# Patient Record
Sex: Female | Born: 1950 | Race: Black or African American | Hispanic: No | Marital: Single
Health system: Southern US, Community
[De-identification: ages and names within clinical notes are randomized; demographics above are authoritative.]

## PROBLEM LIST (undated history)

## (undated) DIAGNOSIS — I10 Essential (primary) hypertension: Secondary | ICD-10-CM

## (undated) DIAGNOSIS — N186 End stage renal disease: Secondary | ICD-10-CM

## (undated) DIAGNOSIS — E785 Hyperlipidemia, unspecified: Secondary | ICD-10-CM

## (undated) HISTORY — PX: CORONARY ANGIOPLASTY WITH STENT PLACEMENT: SHX49

---

## 2016-01-12 ENCOUNTER — Emergency Department (HOSPITAL_COMMUNITY)
Admission: EM | Admit: 2016-01-12 | Discharge: 2016-01-12 | Disposition: A | Discharge status: Home / Self Care | Payer: 59 | Attending: Emergency Medicine | Admitting: Emergency Medicine

## 2016-01-12 ENCOUNTER — Emergency Department (HOSPITAL_COMMUNITY): Payer: 59

## 2016-01-12 ENCOUNTER — Encounter (HOSPITAL_COMMUNITY): Payer: Self-pay | Admitting: *Deleted

## 2016-01-12 DIAGNOSIS — Y929 Unspecified place or not applicable: Secondary | ICD-10-CM | POA: Insufficient documentation

## 2016-01-12 DIAGNOSIS — I120 Hypertensive chronic kidney disease with stage 5 chronic kidney disease or end stage renal disease: Secondary | ICD-10-CM | POA: Insufficient documentation

## 2016-01-12 DIAGNOSIS — Z79899 Other long term (current) drug therapy: Secondary | ICD-10-CM | POA: Insufficient documentation

## 2016-01-12 DIAGNOSIS — W0110XA Fall on same level from slipping, tripping and stumbling with subsequent striking against unspecified object, initial encounter: Secondary | ICD-10-CM | POA: Insufficient documentation

## 2016-01-12 DIAGNOSIS — Y939 Activity, unspecified: Secondary | ICD-10-CM | POA: Diagnosis not present

## 2016-01-12 DIAGNOSIS — N186 End stage renal disease: Secondary | ICD-10-CM | POA: Diagnosis not present

## 2016-01-12 DIAGNOSIS — Z791 Long term (current) use of non-steroidal anti-inflammatories (NSAID): Secondary | ICD-10-CM | POA: Diagnosis not present

## 2016-01-12 DIAGNOSIS — Z7982 Long term (current) use of aspirin: Secondary | ICD-10-CM | POA: Diagnosis not present

## 2016-01-12 DIAGNOSIS — Z23 Encounter for immunization: Secondary | ICD-10-CM | POA: Diagnosis not present

## 2016-01-12 DIAGNOSIS — S8252XA Displaced fracture of medial malleolus of left tibia, initial encounter for closed fracture: Secondary | ICD-10-CM

## 2016-01-12 DIAGNOSIS — Z955 Presence of coronary angioplasty implant and graft: Secondary | ICD-10-CM | POA: Diagnosis not present

## 2016-01-12 DIAGNOSIS — Y999 Unspecified external cause status: Secondary | ICD-10-CM | POA: Diagnosis not present

## 2016-01-12 DIAGNOSIS — S90512A Abrasion, left ankle, initial encounter: Secondary | ICD-10-CM | POA: Diagnosis present

## 2016-01-12 HISTORY — DX: Hyperlipidemia, unspecified: E78.5

## 2016-01-12 HISTORY — DX: End stage renal disease: N18.6

## 2016-01-12 HISTORY — DX: Essential (primary) hypertension: I10

## 2016-01-12 MED ORDER — OXYCODONE-ACETAMINOPHEN 5-325 MG PO TABS: 1.0000 | ORAL_TABLET | Freq: Four times a day (QID) | ORAL | Status: AC | PRN

## 2016-01-12 MED ORDER — NAPROXEN 500 MG PO TABS: 500.0000 mg | ORAL_TABLET | Freq: Two times a day (BID) | ORAL | Status: AC

## 2016-01-12 MED ORDER — TETANUS-DIPHTH-ACELL PERTUSSIS 5-2.5-18.5 LF-MCG/0.5 IM SUSP
0.5000 mL | Freq: Once | INTRAMUSCULAR | Status: AC
  Administered 2016-01-12: 0.5 mL via INTRAMUSCULAR
  Filled 2016-01-12: qty 0.5

## 2016-01-12 MED ORDER — OXYCODONE-ACETAMINOPHEN 5-325 MG PO TABS
2.0000 | ORAL_TABLET | Freq: Once | ORAL | Status: AC
  Administered 2016-01-12: 2 via ORAL
  Filled 2016-01-12: qty 2

## 2016-01-12 NOTE — ED Notes (Signed)
Pt come by EMS with left ankle injury pt tried to get into the jeep but she fell back, no LOC, abrasion to foot/ankle area.  Pt has DM and CBG: 106,

## 2016-01-12 NOTE — Discharge Instructions (Signed)
Wear your splint at all times. Use crutches to prevent from putting weight on your left foot. Take naproxen for pain control. Ice your ankle 3-4 times per day for 15-20 minutes each time. Try and elevate your left leg as much as possible. Follow-up with an orthopedist to ensure proper healing. You may take Percocet as needed for severe pain. Do not drive or drink alcohol after taking this medication.  Ankle Fracture A fracture is a break in a bone. The ankle joint is made up of three bones. These include the lower (distal)sections of your lower leg bones, called the tibia and fibula, along with a bone in your foot, called the talus. Depending on how bad the break is and if more than one ankle joint bone is broken, a cast or splint is used to protect and keep your injured bone from moving while it heals. Sometimes, surgery is required to help the fracture heal properly.  There are two general types of fractures:  Stable fracture. This includes a single fracture line through one bone, with no injury to ankle ligaments. A fracture of the talus that does not have any displacement (movement of the bone on either side of the fracture line) is also stable.  Unstable fracture. This includes more than one fracture line through one or more bones in the ankle joint. It also includes fractures that have displacement of the bone on either side of the fracture line. CAUSES  A direct blow to the ankle.   Quickly and severely twisting your ankle.  Trauma, such as a car accident or falling from a significant height. RISK FACTORS You may be at a higher risk of ankle fracture if:  You have certain medical conditions.  You are involved in high-impact sports.  You are involved in a high-impact car accident. SIGNS AND SYMPTOMS   Tender and swollen ankle.  Bruising around the injured ankle.  Pain on movement of the ankle.  Difficulty walking or putting weight on the ankle.  A cold foot below the site of  the ankle injury. This can occur if the blood vessels passing through your injured ankle were also damaged.  Numbness in the foot below the site of the ankle injury. DIAGNOSIS  An ankle fracture is usually diagnosed with a physical exam and X-rays. A CT scan may also be required for complex fractures. TREATMENT  Stable fractures are treated with a cast or splint and using crutches to avoid putting weight on your injured ankle. This is followed by an ankle strengthening program. Some patients require a special type of cast, depending on other medical problems they may have. Unstable fractures require surgery to ensure the bones heal properly. Your health care provider will tell you what type of fracture you have and the best treatment for your condition. HOME CARE INSTRUCTIONS   Review correct crutch use with your health care provider and use your crutches as directed. Safe use of crutches is extremely important. Misuse of crutches can cause you to fall or cause injury to nerves in your hands or armpits.  Do not put weight or pressure on the injured ankle until directed by your health care provider.  To lessen the swelling, keep the injured leg elevated while sitting or lying down.  Apply ice to the injured area:  Put ice in a plastic bag.  Place a towel between your cast and the bag.  Leave the ice on for 20 minutes, 2-3 times a day.  If you have  a plaster or fiberglass cast:  Do not try to scratch the skin under the cast with any objects. This can increase your risk of skin infection.  Check the skin around the cast every day. You may put lotion on any red or sore areas.  Keep your cast dry and clean.  If you have a plaster splint:  Wear the splint as directed.  You may loosen the elastic around the splint if your toes become numb, tingle, or turn cold or blue.  Do not put pressure on any part of your cast or splint; it may break. Rest your cast only on a pillow the first 24  hours until it is fully hardened.  Your cast or splint can be protected during bathing with a plastic bag sealed to your skin with medical tape. Do not lower the cast or splint into water.  Take medicines as directed by your health care provider. Only take over-the-counter or prescription medicines for pain, discomfort, or fever as directed by your health care provider.  Do not drive a vehicle until your health care provider specifically tells you it is safe to do so.  If your health care provider has given you a follow-up appointment, it is very important to keep that appointment. Not keeping the appointment could result in a chronic or permanent injury, pain, and disability. If you have any problem keeping the appointment, call the facility for assistance. SEEK MEDICAL CARE IF: You develop increased swelling or discomfort. SEEK IMMEDIATE MEDICAL CARE IF:   Your cast gets damaged or breaks.  You have continued severe pain.  You develop new pain or swelling after the cast was put on.  Your skin or toenails below the injury turn blue or gray.  Your skin or toenails below the injury feel cold, numb, or have loss of sensitivity to touch.  There is a bad smell or pus draining from under the cast. MAKE SURE YOU:   Understand these instructions.  Will watch your condition.  Will get help right away if you are not doing well or get worse.   This information is not intended to replace advice given to you by your health care provider. Make sure you discuss any questions you have with your health care provider.   Document Released: 07/26/2000 Document Revised: 08/03/2013 Document Reviewed: 02/25/2013 Elsevier Interactive Patient Education [employer]2016 [employer]

## 2016-01-12 NOTE — ED Notes (Signed)
[facility] Expected date:  Expected time:  Means of arrival:  Comments: EMS 65yo F ankle pain

## 2016-01-12 NOTE — ED Provider Notes (Signed)
CSN: [id number]     Arrival date & time 01/12/16  2042 History   First MD Initiated Contact with Patient 01/12/16 2054     Chief Complaint  Patient presents with  . Ankle Injury    Left     (Consider location/radiation/quality/duration/timing/severity/associated sxs/prior Treatment) HPI Comments: 65 year old female presents to the emergency department for evaluation of left ankle pain. Patient states that she was trying to get into a jeep when she fell backwards causing her left ankle to roll. She has an abrasion to the medial aspect of her ankle which she attributes to that side hitting the ground. She cannot recall the date of her last tetanus shot. Patient reports that pain is worse with ambulation. No medications taken prior to arrival. Patient transported to the ED by EMS. She does have a history of diabetes mellitus. She denies head trauma or loss of consciousness from her fall. No history of prior left ankle injury.  Patient is a 65 y.o. female presenting with lower extremity injury. The history is provided by the patient. No language interpreter was used.  Ankle Injury Associated symptoms include arthralgias and joint swelling. Pertinent negatives include no numbness or weakness.    Past Medical History  Diagnosis Date  . ESRD (end stage renal disease) (HCC)   . Hypertension   . Hyperlipidemia    Past Surgical History  Procedure Laterality Date  . Coronary angioplasty with stent placement     No family history on file. Social History  Substance Use Topics  . Smoking status: Not on file  . Smokeless tobacco: Not on file  . Alcohol Use: Not on file   OB History    No data available      Review of Systems  Musculoskeletal: Positive for joint swelling and arthralgias.  Skin: Positive for wound.  Neurological: Negative for weakness and numbness.  Ten systems reviewed and are negative for acute change, except as noted in the HPI.    Allergies  Review of patient's  allergies indicates no known allergies.  Home Medications   Prior to Admission medications   Medication Sig Start Date End Date Taking? Authorizing Provider  amLODipine (NORVASC) 5 MG tablet Take 5 mg by mouth daily. 12/17/15  Yes Historical Provider, MD  aspirin EC 81 MG tablet Take 81 mg by mouth daily.   Yes Historical Provider, MD  metoprolol (LOPRESSOR) 50 MG tablet Take 50 mg by mouth 2 (two) times daily. 12/24/15  Yes Historical Provider, MD  naproxen (NAPROSYN) 500 MG tablet Take 1 tablet (500 mg total) by mouth 2 (two) times daily. 01/12/16   Antony MaduraKelly Mylee Falin, PA-C  oxyCODONE-acetaminophen (PERCOCET/ROXICET) 5-325 MG tablet Take 1-2 tablets by mouth every 6 (six) hours as needed for severe pain. 01/12/16   Antony MaduraKelly Nalia Honeycutt, PA-C   BP 194/110 mmHg  Pulse 95  Temp(Src) 98.6 F (37 C) (Oral)  Resp 20  SpO2 96%   Physical Exam  Constitutional: [name], place, and time. She appears well-developed and well-nourished. No distress.  Nontoxic appearing and in no distress.  HENT:  Head: Normocephalic and atraumatic.  Eyes: Conjunctivae and EOM are normal. No scleral icterus.  Neck: Normal range of motion.  Cardiovascular: Normal rate, regular rhythm and intact distal pulses.   DP and PT pulses 2+ in the LLE Capillary refill brisk in all digits of L foot.  Pulmonary/Chest: Effort normal. No respiratory distress.  Respirations even and unlabored  Musculoskeletal: Normal range of motion.  Neurological: She is  alert and oriented to person, place, and time. She exhibits normal muscle tone. Coordination normal.  Sensation to light touch intact in the LLE. Patient able to wiggle all toes.  Skin: Skin is warm and dry. No rash noted. She is not diaphoretic. No erythema. No pallor.  Psychiatric: She has a normal mood and affect. Her behavior is normal.  Nursing note and vitals reviewed.   ED Course  Procedures (including critical care time) Labs Review Labs Reviewed - No data to  display  Imaging Review Dg Ankle Complete Left  01/12/2016  CLINICAL DATA:  Fall, left ankle injury. EXAM: LEFT ANKLE COMPLETE - 3+ VIEW COMPARISON:  None. FINDINGS: There is a transverse fracture through the medial malleolus. No fibular abnormality. Ankle mortise is intact. Degenerative changes in the left ankle. Diffuse soft tissue swelling. IMPRESSION: Transverse fracture through the medial malleolus. Electronically Signed   By: Charlett Nose M.D.   On: 01/12/2016 21:42     I have personally reviewed and evaluated these images and lab results as part of my medical decision-making.   EKG Interpretation None      MDM   Final diagnoses:  Medial malleolar fracture, left, closed, initial encounter    65 year old female presents to the emergency department for evaluation of left ankle pain. She is neurovascularly intact. X-ray shows a transverse fracture of the medial malleolus. Stirrup splint initially ordered, the patient is a poor candidate for this given her body habitus. Will place in [facility] and provided crutches. Patient referred to orthopedics for follow-up to ensure proper healing. Return precautions discussed and provided. Patient discharged in satisfactory condition with no unaddressed concerns.    Antony Madura, PA-C 01/13/16 0011  Doug Sou, MD 01/13/16 [phone]

## 2017-02-01 IMAGING — CR DG ANKLE COMPLETE 3+V*L*
3 series · 3 of 3 positions shown · non-contrast
Comparison: None.

CLINICAL DATA: Fall, left ankle injury.

EXAM:
LEFT ANKLE COMPLETE - 3+ VIEW

[x ankle lat left]
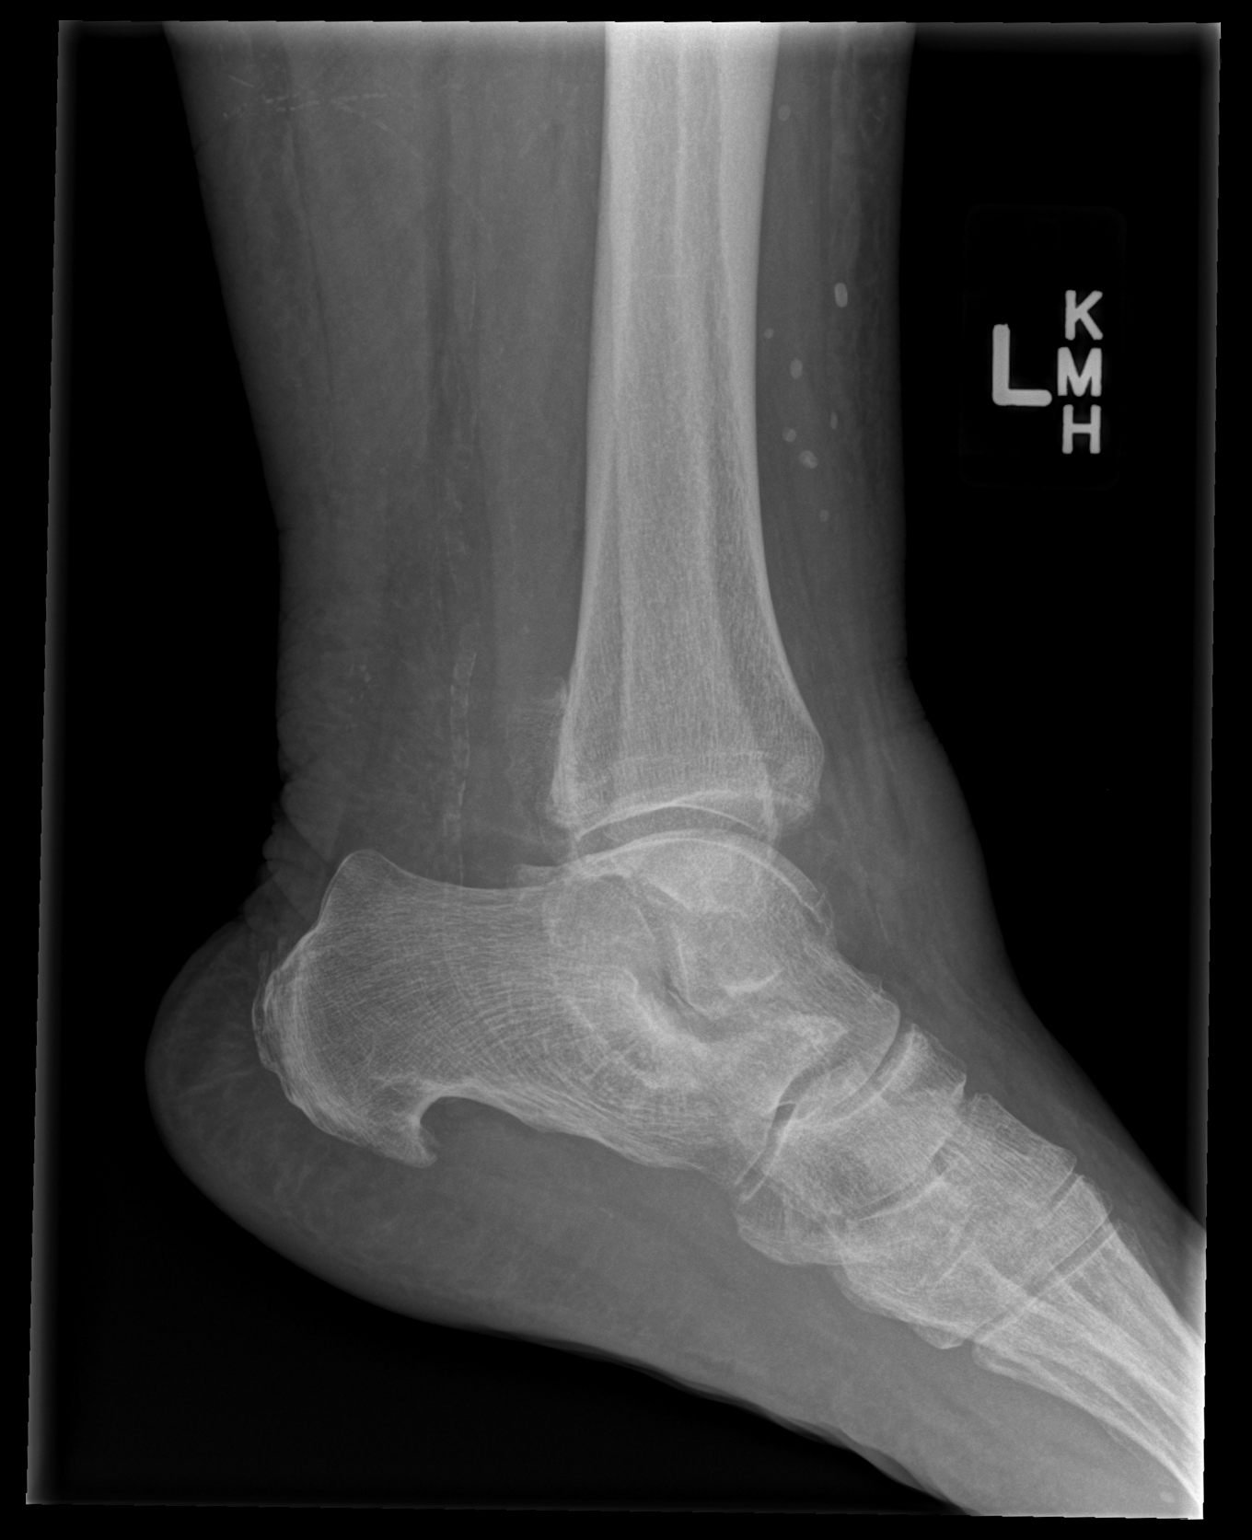

[x ankle ap left]
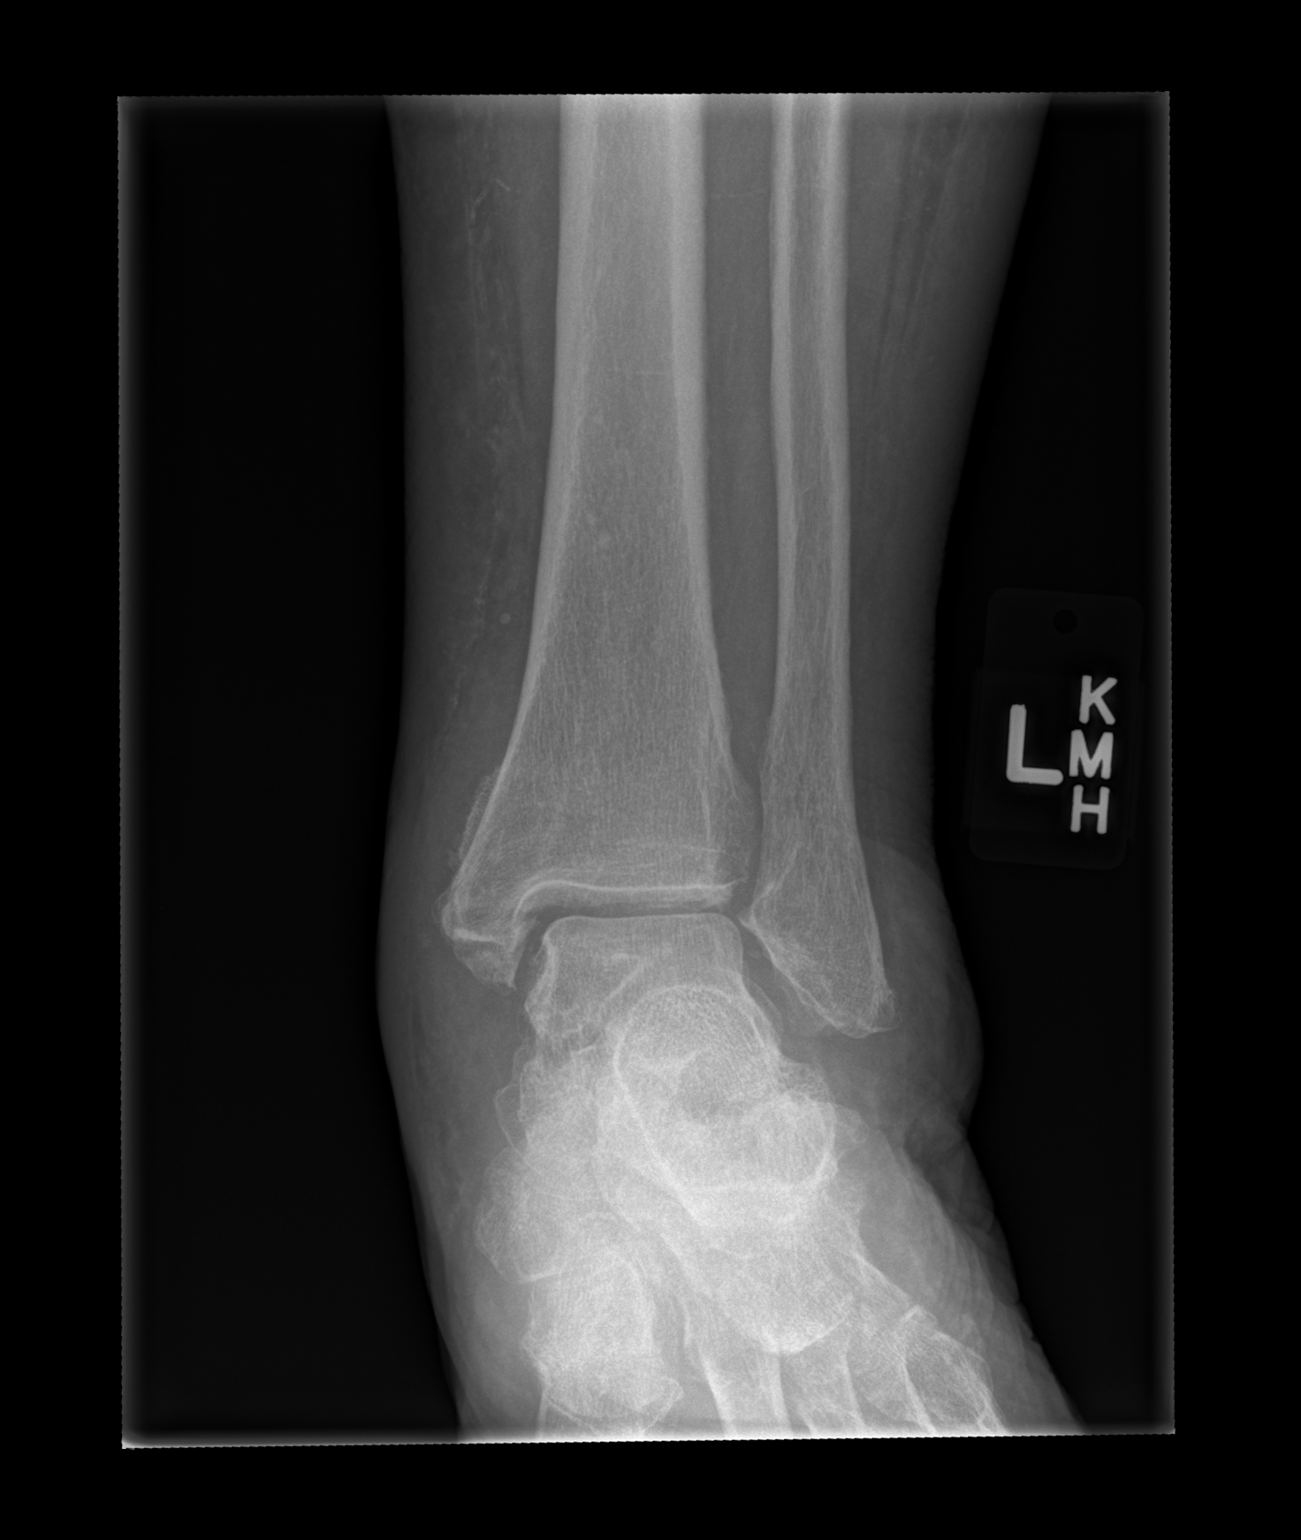

[x ankle obl left]
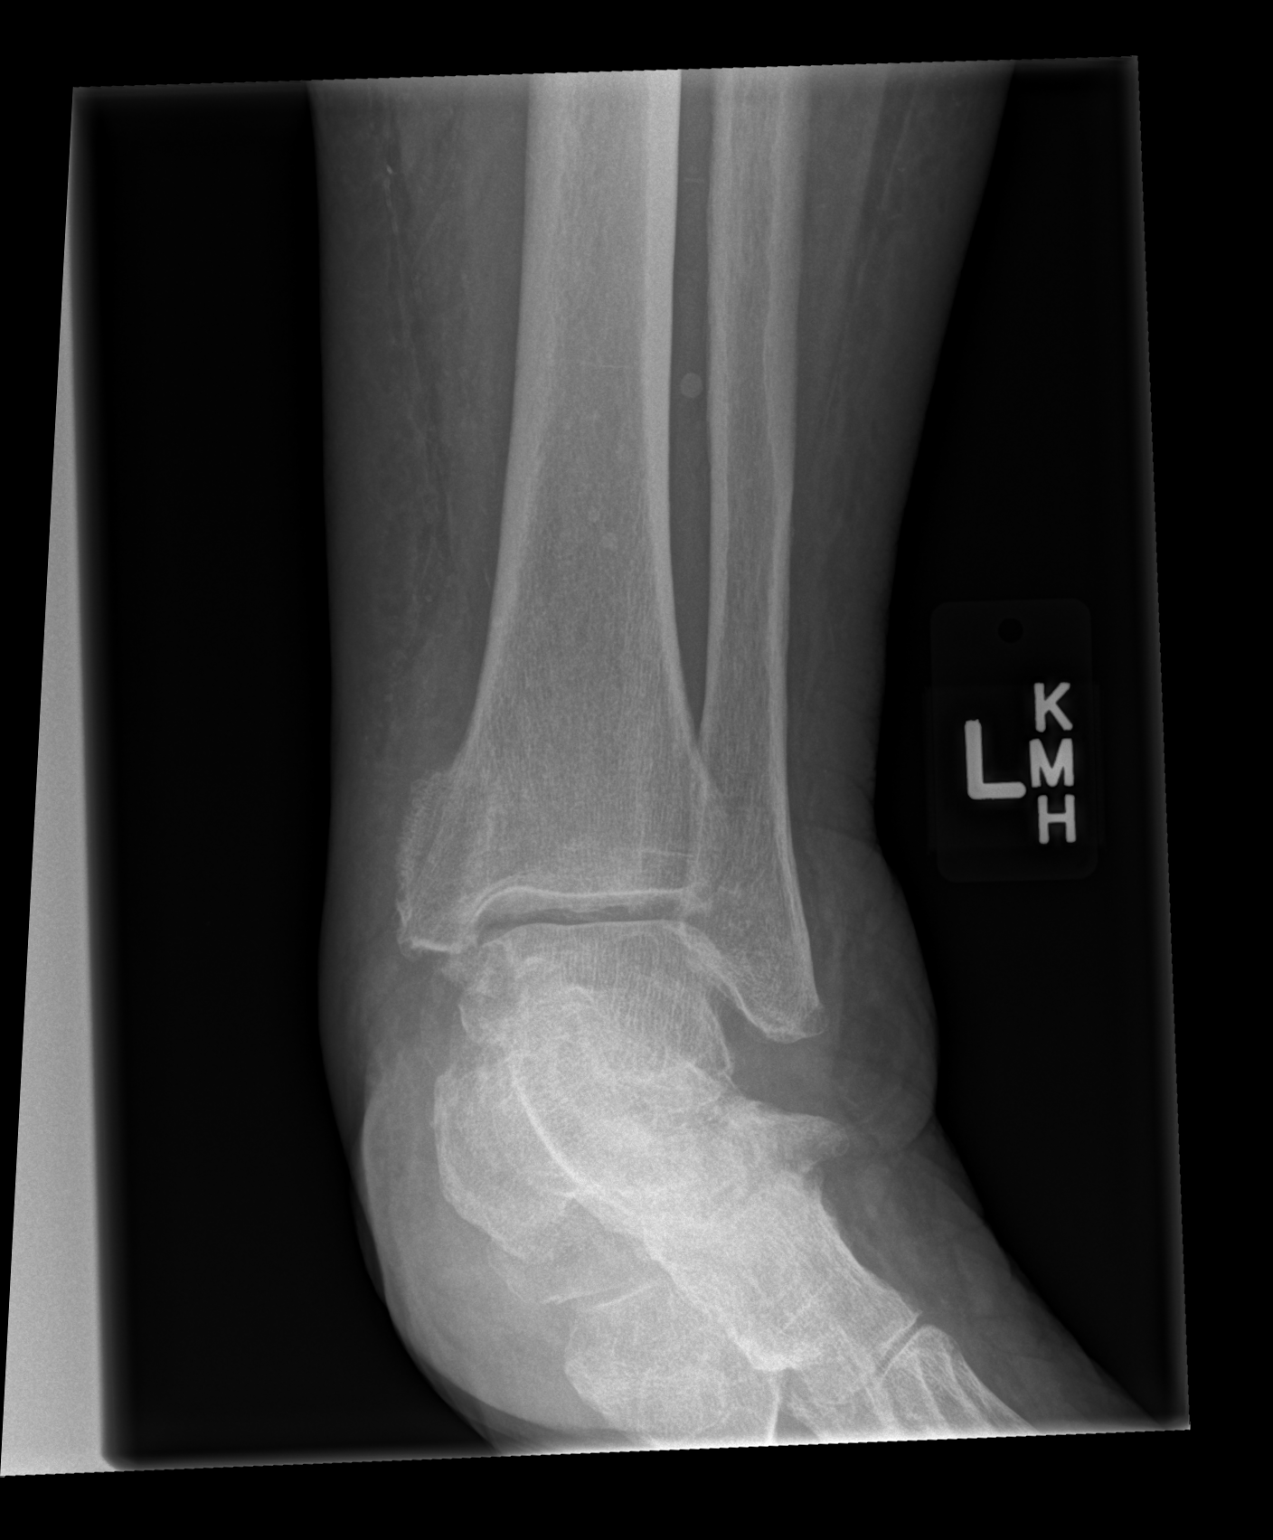

[3 of 3 positions shown; findings below may reference images not displayed]

FINDINGS: There is a transverse fracture through the medial malleolus. No
fibular abnormality. Ankle mortise is intact. Degenerative changes
in the left ankle. Diffuse soft tissue swelling.
IMPRESSION: Transverse fracture through the medial malleolus.

## 2018-12-11 DEATH — deceased
